# Patient Record
Sex: Male | Born: 1990 | Race: Black or African American | Hispanic: No | Marital: Single | Smoking: Current every day smoker
Health system: Southern US, Community
[De-identification: ages and names within clinical notes are randomized; demographics above are authoritative.]

---

## 2017-07-06 ENCOUNTER — Encounter (HOSPITAL_COMMUNITY): Payer: Self-pay | Admitting: Emergency Medicine

## 2017-07-06 ENCOUNTER — Emergency Department (HOSPITAL_COMMUNITY): Payer: Self-pay

## 2017-07-06 ENCOUNTER — Emergency Department (HOSPITAL_COMMUNITY)
Admission: EM | Admit: 2017-07-06 | Discharge: 2017-07-06 | Disposition: A | Discharge status: Home / Self Care | Payer: Self-pay | Attending: Emergency Medicine | Admitting: Emergency Medicine

## 2017-07-06 ENCOUNTER — Other Ambulatory Visit: Payer: Self-pay

## 2017-07-06 DIAGNOSIS — K5904 Chronic idiopathic constipation: Secondary | ICD-10-CM | POA: Insufficient documentation

## 2017-07-06 DIAGNOSIS — R109 Unspecified abdominal pain: Secondary | ICD-10-CM

## 2017-07-06 DIAGNOSIS — F17210 Nicotine dependence, cigarettes, uncomplicated: Secondary | ICD-10-CM | POA: Insufficient documentation

## 2017-07-06 LAB — URINALYSIS, ROUTINE W REFLEX MICROSCOPIC
BACTERIA UA: NONE SEEN
Glucose, UA: NEGATIVE mg/dL
Hgb urine dipstick: NEGATIVE
Ketones, ur: NEGATIVE mg/dL
Leukocytes, UA: NEGATIVE
Nitrite: NEGATIVE
PH: 5 (ref 5.0–8.0)
Protein, ur: 100 mg/dL — AB
SPECIFIC GRAVITY, URINE: 1.035 — AB (ref 1.005–1.030)

## 2017-07-06 LAB — LIPASE, BLOOD: Lipase: 22 U/L (ref 11–51)

## 2017-07-06 LAB — COMPREHENSIVE METABOLIC PANEL
ALBUMIN: 4.3 g/dL (ref 3.5–5.0)
ALT: 41 U/L (ref 17–63)
AST: 34 U/L (ref 15–41)
Alkaline Phosphatase: 82 U/L (ref 38–126)
Anion gap: 10 (ref 5–15)
BILIRUBIN TOTAL: 0.6 mg/dL (ref 0.3–1.2)
BUN: 9 mg/dL (ref 6–20)
CHLORIDE: 99 mmol/L — AB (ref 101–111)
CO2: 29 mmol/L (ref 22–32)
CREATININE: 0.98 mg/dL (ref 0.61–1.24)
Calcium: 9.1 mg/dL (ref 8.9–10.3)
GFR calc Af Amer: >60 mL/min (ref >60)
Glucose, Bld: 92 mg/dL (ref 65–99)
POTASSIUM: 4 mmol/L (ref 3.5–5.1)
Sodium: 138 mmol/L (ref 135–145)
TOTAL PROTEIN: 6.9 g/dL (ref 6.5–8.1)

## 2017-07-06 LAB — CBC
HEMATOCRIT: 40.8 % (ref 39.0–52.0)
Hemoglobin: 13.6 g/dL (ref 13.0–17.0)
MCH: 30.4 pg (ref 26.0–34.0)
MCHC: 33.3 g/dL (ref 30.0–36.0)
MCV: 91.3 fL (ref 78.0–100.0)
PLATELETS: 217 10*3/uL (ref 150–400)
RBC: 4.47 MIL/uL (ref 4.22–5.81)
RDW: 13.3 % (ref 11.5–15.5)
WBC: 6.5 10*3/uL (ref 4.0–10.5)

## 2017-07-06 MED ORDER — MAGNESIUM CITRATE PO SOLN
1.0000 | Freq: Once | ORAL | Status: AC
  Administered 2017-07-06: 1 via ORAL
  Filled 2017-07-06: qty 296

## 2017-07-06 MED ORDER — PEG 3350-KCL-NABCB-NACL-NASULF 236 G PO SOLR: 4.0000 L | Freq: Once | ORAL | 0 refills | Status: AC

## 2017-07-06 NOTE — Discharge Instructions (Signed)
If you do not have a good bowel movement in the next six hours, then start taking the GoLytely - drink eight ounces every fifteen minutes until you are passing clear liquid.  Return if your pain is getting worse.

## 2017-07-06 NOTE — ED Triage Notes (Signed)
Pt reports lower abdominal pains for a couple of days, denies N/V/V but does report some difficulty urinating.

## 2017-07-06 NOTE — ED Notes (Signed)
Patient transported to X-ray 

## 2017-07-06 NOTE — ED Notes (Signed)
Pt departed in NAD, refused use of wheelchair.  

## 2017-07-06 NOTE — ED Provider Notes (Signed)
[facility]   CSN: [id number] Arrival date & time: 07/06/17  0139     History   Chief Complaint Chief Complaint  Patient presents with  . Abdominal Pain    HPI Francisco Hart is a 27 y.o. male.  The history is provided by the patient.  He complains of left-sided abdominal pain for the last 3 days.  Pain is dull and constant.  Nothing makes it better, nothing makes it worse.  He denies radiation of pain.  He denies nausea or vomiting.  He has been constipated, but pain was not affected when he had his last bowel movement which was yesterday.  He is noted it takes him longer to urinate than normal, but there is no dysuria.  He denies fever or chills.  Pain is rated at 8/10.  He came in tonight because he was having difficulty sleeping because of the pain.  History reviewed. No pertinent past medical history.  There are no active problems to display for this patient.   History reviewed. No pertinent surgical history.     Home Medications    Prior to Admission medications   Not on File    Family History No family history on file.  Social History Social History   Tobacco Use  . Smoking status: Current Every Day Smoker    Packs/day: 0.50    Types: Cigarettes  . Smokeless tobacco: Never Used  Substance Use Topics  . Alcohol use: No    Frequency: Never  . Drug use: No     Allergies   Patient has no known allergies.   Review of Systems Review of Systems  All other systems reviewed and are negative.    Physical Exam Updated Vital Signs BP 111/64   Pulse 68   Temp 98 F (36.7 C) (Oral)   Resp 16   Ht 5\' 9"  (1.753 m)   Wt 63.5 kg (140 lb)   SpO2 98%   BMI 20.67 kg/m   Physical Exam  Nursing note and vitals reviewed.  27 year old male, resting comfortably and in no acute distress. Vital signs are normal. Oxygen saturation is 98%, which is normal. Head is normocephalic and atraumatic. PERRLA,  EOMI. Oropharynx is clear. Neck is nontender and supple without adenopathy or JVD. Back is nontender and there is no CVA tenderness. Lungs are clear without rales, wheezes, or rhonchi. Chest is nontender. Heart has regular rate and rhythm without murmur. Abdomen is soft, flat, nontender without masses or hepatosplenomegaly and peristalsis is normoactive. Extremities have no cyanosis or edema, full range of motion is present. Skin is warm and dry without rash. Neurologic: Mental status is normal, cranial nerves are intact, there are no motor or sensory deficits.  ED Treatments / Results  Labs (all labs ordered are listed, but only abnormal results are displayed) Labs Reviewed  COMPREHENSIVE METABOLIC PANEL - Abnormal; Notable for the following components:      Result Value   Chloride 99 (*)    All other components within normal limits  URINALYSIS, ROUTINE W REFLEX MICROSCOPIC - Abnormal; Notable for the following components:   Color, Urine AMBER (*)    Specific Gravity, Urine 1.035 (*)    Bilirubin Urine SMALL (*)    Protein, ur 100 (*)    Squamous Epithelial / LPF 0-5 (*)    All other components within normal limits  LIPASE, BLOOD  CBC    Radiology Dg Abdomen 1 View  Result Date:  07/06/2017 CLINICAL DATA:  Left lower abdominal pain for couple of days. Difficulty urinating. EXAM: ABDOMEN - 1 VIEW COMPARISON:  None. FINDINGS: Gas and stool throughout the colon. No small or large bowel distention. No radiopaque stones. Visualized bones and soft tissue contours appear intact. IMPRESSION: Nonobstructive bowel gas pattern with stool-filled colon. Electronically Signed   By: Burman Nieves M.D.   On: 07/06/2017 05:36    Procedures Procedures   Medications Ordered in ED Medications  magnesium citrate solution 1 Bottle (not administered)     Initial Impression / Assessment and Plan / ED Course  I have reviewed the triage vital signs and the nursing notes.  Pertinent labs &  imaging results that were available during my care of the patient were reviewed by me and considered in my medical decision making (see chart for details).  Abdominal pain of uncertain cause.  Exam is benign.  Blood work is normal.  Urinalysis significant for high specific gravity and proteinuria, but no evidence of urinary tract infection.  He has no prior records in the [facility] system.  Will check KUB to assess stool burden.  KUB shows large amount of stool-especially in the rectosigmoid.  I suspect that this is the cause of his pain.  He is given a dose of magnesium citrate.  Also given prescription for polyethylene glycol to take if magnesium citrate is not effective.  Return precautions discussed.  Final Clinical Impressions(s) / ED Diagnoses   Final diagnoses:  Left sided abdominal pain  Functional constipation    ED Discharge Orders        Ordered    polyethylene glycol (GOLYTELY) 236 g solution   Once     07/06/17 0554       Dione Booze, MD 07/06/17 [phone]

## 2018-12-27 IMAGING — CR DG ABDOMEN 1V
1 series · 1 of 1 positions shown · non-contrast
Comparison: None.

CLINICAL DATA: Left lower abdominal pain for couple of days.
Difficulty urinating.

EXAM:
ABDOMEN - 1 VIEW

[abdomen kub]
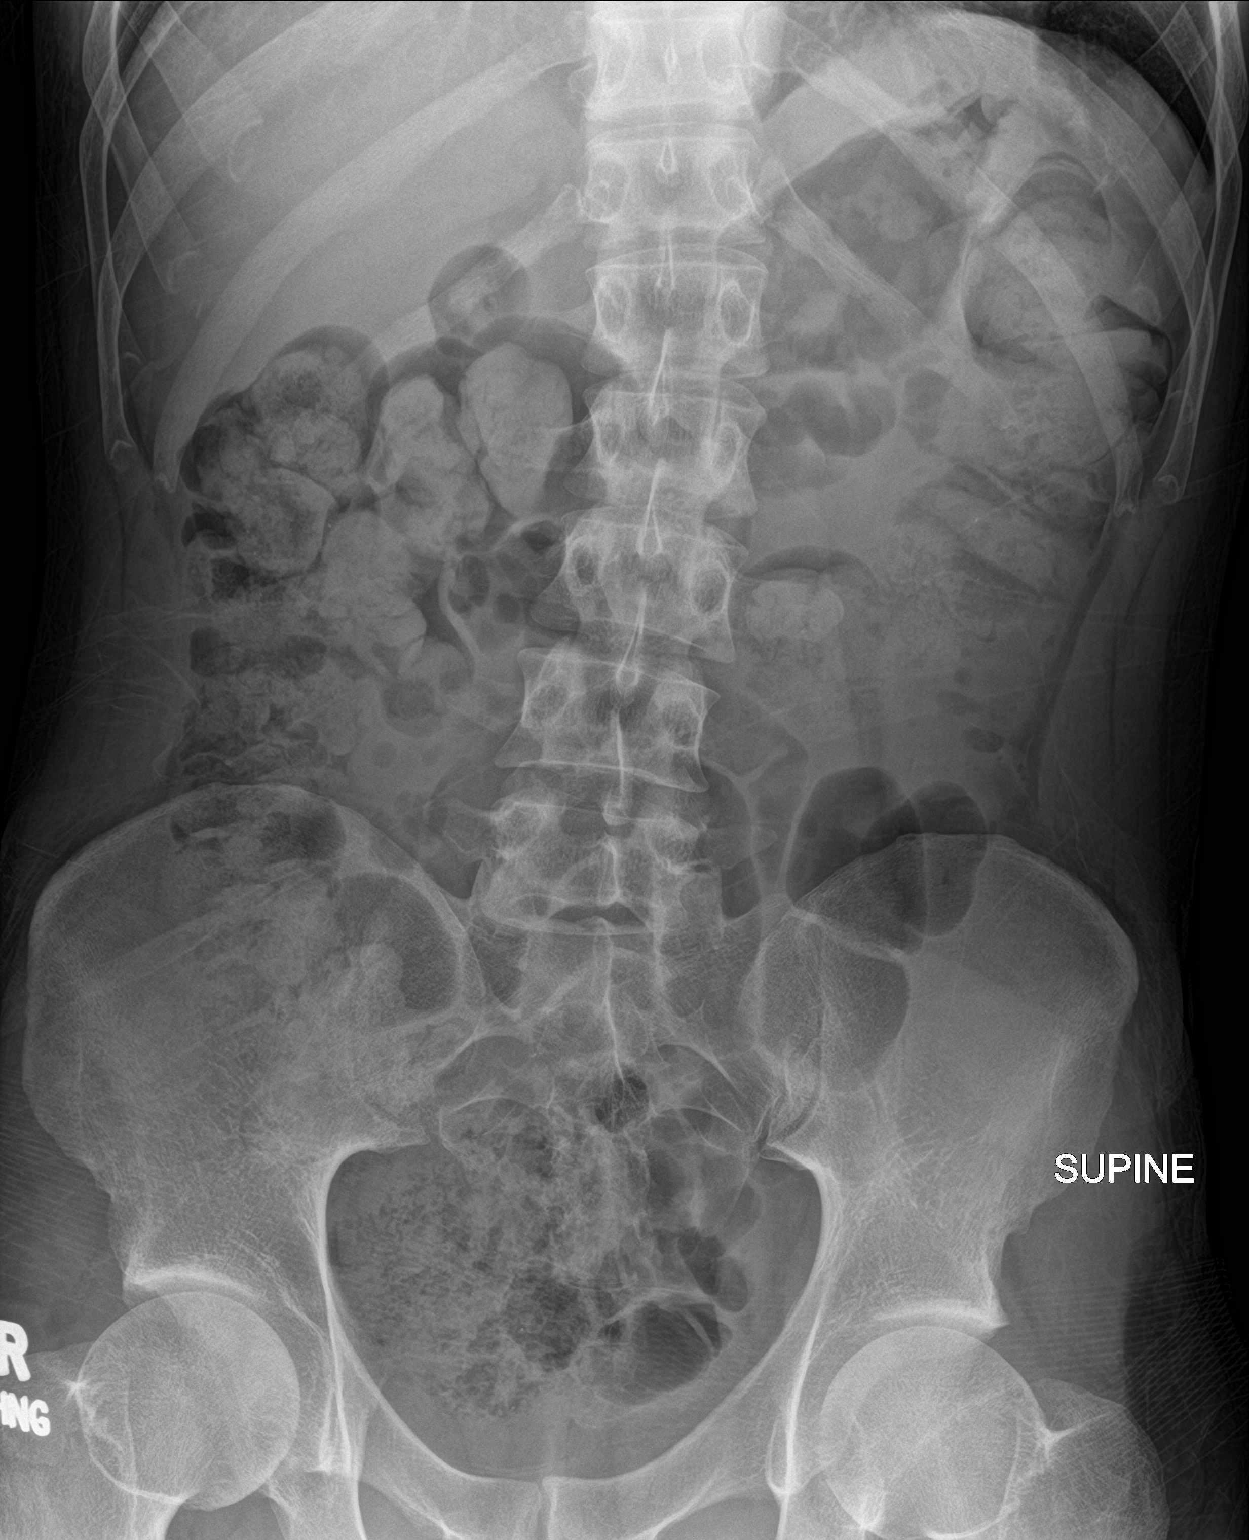

[1 of 1 positions shown; findings below may reference images not displayed]

FINDINGS: Gas and stool throughout the colon. No small or large bowel
distention. No radiopaque stones. Visualized bones and soft tissue
contours appear intact.
IMPRESSION: Nonobstructive bowel gas pattern with stool-filled colon.
